# Patient Record
Sex: Female | Born: 1995 | Race: White | Hispanic: No | Marital: Single | Smoking: Never smoker
Health system: Southern US, Community
[De-identification: ages and names within clinical notes are randomized; demographics above are authoritative.]

## PROBLEM LIST (undated history)

## (undated) DIAGNOSIS — D690 Allergic purpura: Secondary | ICD-10-CM

## (undated) HISTORY — DX: Allergic purpura: D69.0

---

## 2017-01-26 ENCOUNTER — Encounter: Payer: Self-pay | Admitting: Emergency Medicine

## 2017-01-26 ENCOUNTER — Emergency Department
Admission: EM | Admit: 2017-01-26 | Discharge: 2017-01-26 | Disposition: A | Discharge status: Home / Self Care | Payer: No Typology Code available for payment source | Attending: Emergency Medicine | Admitting: Emergency Medicine

## 2017-01-26 ENCOUNTER — Emergency Department: Payer: No Typology Code available for payment source

## 2017-01-26 DIAGNOSIS — S40012A Contusion of left shoulder, initial encounter: Secondary | ICD-10-CM | POA: Diagnosis not present

## 2017-01-26 DIAGNOSIS — S20212A Contusion of left front wall of thorax, initial encounter: Secondary | ICD-10-CM | POA: Diagnosis not present

## 2017-01-26 DIAGNOSIS — Y999 Unspecified external cause status: Secondary | ICD-10-CM | POA: Diagnosis not present

## 2017-01-26 DIAGNOSIS — S299XXA Unspecified injury of thorax, initial encounter: Secondary | ICD-10-CM | POA: Diagnosis present

## 2017-01-26 DIAGNOSIS — Y92410 Unspecified street and highway as the place of occurrence of the external cause: Secondary | ICD-10-CM | POA: Insufficient documentation

## 2017-01-26 DIAGNOSIS — Y939 Activity, unspecified: Secondary | ICD-10-CM | POA: Diagnosis not present

## 2017-01-26 MED ORDER — MELOXICAM 15 MG PO TABS: 15.0000 mg | ORAL_TABLET | Freq: Every day | ORAL | 0 refills | Status: DC
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
## 2019-01-28 ENCOUNTER — Encounter: Payer: Self-pay | Admitting: Primary Care

## 2019-01-28 ENCOUNTER — Ambulatory Visit: Payer: 59 | Admitting: Primary Care

## 2019-01-28 ENCOUNTER — Other Ambulatory Visit: Payer: Self-pay

## 2019-01-28 VITALS — BP 120/78 | HR 82 | Temp 98.1°F | Ht 68.0 in | Wt 281.5 lb

## 2019-01-28 DIAGNOSIS — Z23 Encounter for immunization: Secondary | ICD-10-CM | POA: Diagnosis not present

## 2019-01-28 DIAGNOSIS — Z0000 Encounter for general adult medical examination without abnormal findings: Secondary | ICD-10-CM

## 2019-01-28 LAB — LIPID PANEL
Cholesterol: 190 mg/dL (ref 0–200)
HDL: 46.9 mg/dL (ref >39.00)
LDL CALC: 120 mg/dL — AB (ref 0–99)
NONHDL: 142.94
Total CHOL/HDL Ratio: 4
Triglycerides: 113 mg/dL (ref 0.0–149.0)
VLDL: 22.6 mg/dL (ref 0.0–40.0)

## 2019-01-28 LAB — COMPREHENSIVE METABOLIC PANEL
ALT: 20 U/L (ref 0–35)
AST: 16 U/L (ref 0–37)
Albumin: 4.6 g/dL (ref 3.5–5.2)
Alkaline Phosphatase: 73 U/L (ref 39–117)
BUN: 13 mg/dL (ref 6–23)
CHLORIDE: 106 meq/L (ref 96–112)
CO2: 24 meq/L (ref 19–32)
Calcium: 9.8 mg/dL (ref 8.4–10.5)
Creatinine, Ser: 0.81 mg/dL (ref 0.40–1.20)
GFR: 87.03 mL/min (ref >60.00)
GLUCOSE: 91 mg/dL (ref 70–99)
POTASSIUM: 4.3 meq/L (ref 3.5–5.1)
SODIUM: 138 meq/L (ref 135–145)
Total Bilirubin: 0.4 mg/dL (ref 0.2–1.2)
Total Protein: 7.4 g/dL (ref 6.0–8.3)

## 2019-01-28 LAB — HEMOGLOBIN A1C: Hgb A1c MFr Bld: 5.4 % (ref 4.6–6.5)

## 2019-01-28 NOTE — Patient Instructions (Signed)
Stop by the lab prior to leaving today. I will notify you of your results once received.   Start exercising. You should be getting 150 minutes of moderate intensity exercise weekly.  It's important to improve your diet by reducing consumption of fast food, fried food, processed snack foods, sugary drinks. Increase consumption of fresh vegetables and fruits, whole grains, water.  Ensure you are drinking 64 ounces of water daily.  Schedule a follow up visit for 2 months for your pap smear and HPV vaccination.   It was a pleasure to meet you today! Please don't hesitate to call or message me with any questions. Welcome to [facility]!   Preventive Care 18-39 Years, Female Preventive care refers to lifestyle choices and visits with your health care provider that can promote health and wellness. What does preventive care include?   A yearly physical exam. This is also called an annual well check.  Dental exams once or twice a year.  Routine eye exams. Ask your health care provider how often you should have your eyes checked.  Personal lifestyle choices, including: ? Daily care of your teeth and gums. ? Regular physical activity. ? Eating a healthy diet. ? Avoiding tobacco and drug use. ? Limiting alcohol use. ? Practicing safe sex. ? Taking vitamin and mineral supplements as recommended by your health care provider. What happens during an annual well check? The services and screenings done by your health care provider during your annual well check will depend on your age, overall health, lifestyle risk factors, and family history of disease. Counseling Your health care provider may ask you questions about your:  Alcohol use.  Tobacco use.  Drug use.  Emotional well-being.  Home and relationship well-being.  Sexual activity.  Eating habits.  Work and work Statistician.  Method of birth control.  Menstrual cycle.  Pregnancy history. Screening You may have the  following tests or measurements:  Height, weight, and BMI.  Diabetes screening. This is done by checking your blood sugar (glucose) after you have not eaten for a while (fasting).  Blood pressure.  Lipid and cholesterol levels. These may be checked every 5 years starting at age 56.  Skin check.  Hepatitis C blood test.  Hepatitis B blood test.  Sexually transmitted disease (STD) testing.  BRCA-related cancer screening. This may be done if you have a family history of breast, ovarian, tubal, or peritoneal cancers.  Pelvic exam and Pap test. This may be done every 3 years starting at age 50. Starting at age 37, this may be done every 5 years if you have a Pap test in combination with an HPV test. Discuss your test results, treatment options, and if necessary, the need for more tests with your health care provider. Vaccines Your health care provider may recommend certain vaccines, such as:  Influenza vaccine. This is recommended every year.  Tetanus, diphtheria, and acellular pertussis (Tdap, Td) vaccine. You may need a Td booster every 10 years.  Varicella vaccine. You may need this if you have not been vaccinated.  HPV vaccine. If you are 51 or younger, you may need three doses over 6 months.  Measles, mumps, and rubella (MMR) vaccine. You may need at least one dose of MMR. You may also need a second dose.  Pneumococcal 13-valent conjugate (PCV13) vaccine. You may need this if you have certain conditions and were not previously vaccinated.  Pneumococcal polysaccharide (PPSV23) vaccine. You may need one or two doses if you smoke cigarettes or if  you have certain conditions.  Meningococcal vaccine. One dose is recommended if you are age 22-21 years and a first-year college student living in a residence hall, or if you have one of several medical conditions. You may also need additional booster doses.  Hepatitis A vaccine. You may need this if you have certain conditions or if  you travel or work in places where you may be exposed to hepatitis A.  Hepatitis B vaccine. You may need this if you have certain conditions or if you travel or work in places where you may be exposed to hepatitis B.  Haemophilus influenzae type b (Hib) vaccine. You may need this if you have certain risk factors. Talk to your health care provider about which screenings and vaccines you need and how often you need them. This information is not intended to replace advice given to you by your health care provider. Make sure you discuss any questions you have with your health care provider. Document Released: 12/30/2001 Document Revised: 06/16/2017 Document Reviewed: 09/04/2015 Elsevier Interactive Patient Education  2019 [employer].

## 2019-01-28 NOTE — Assessment & Plan Note (Signed)
Tetanus due provided today. HPV series due, completed first dose today. Discussed returning for subsequent vaccinations.  Pap smear due as she has never completed, discussed to return for this later this year when ready.  Recommended to start with regular exercise, work on a healthy diet.  Exam unremarkable. Labs pending. Follow up in 1 year for CPE.

## 2019-01-28 NOTE — Progress Notes (Signed)
Subjective:    Patient ID: Yolanda Rojas, female    DOB: [birth year], 22 y.o.   MRN: [id number]  HPI  Yolanda Rojas is a 23 year old female who presents today to establish care and for complete physical. She has no complaints today.  Immunizations: -Tetanus: Unsure -Influenza: Did not complete, declines  -HPV: Never completed  Diet: She endorses a fair diet Breakfast: Skips Lunch: Skip Dinner: Meat, vegetable, starch, pizza, take out Snacks: Chips, crackers, cheese Desserts: 2-3 times weekly  Beverages: Water, unsweet  Exercise: No recent exercise  Eye exam: Completed several years ago Dental exam: Completes semi-annually  Pap Smear: Never completed,declines today  BP Readings from Last 3 Encounters:  01/28/19 120/78  01/26/17 136/79     Review of Systems  Constitutional: Negative for unexpected weight change.  HENT: Negative for rhinorrhea.   Respiratory: Negative for cough and shortness of breath.   Cardiovascular: Negative for chest pain.  Gastrointestinal: Negative for constipation and diarrhea.  Genitourinary: Negative for difficulty urinating and menstrual problem.  Musculoskeletal: Negative for arthralgias and myalgias.  Skin: Negative for rash.  Allergic/Immunologic: Negative for environmental allergies.  Neurological: Negative for dizziness, numbness and headaches.  Psychiatric/Behavioral: The patient is not nervous/anxious.        Past Medical History:  Diagnosis Date  . HSP (Henoch Schonlein purpura) (HCC)      Social History   Socioeconomic History  . Marital status: Single    Spouse name: Not on file  . Number of children: Not on file  . Years of education: Not on file  . Highest education level: Not on file  Occupational History  . Not on file  Social Needs  . Financial resource strain: Not on file  . Food insecurity:    Worry: Not on file    Inability: Not on file  . Transportation needs:    Medical: Not on file    Non-medical: Not on  file  Tobacco Use  . Smoking status: Never Smoker  . Smokeless tobacco: Never Used  Substance and Sexual Activity  . Alcohol use: Yes  . Drug use: Not on file  . Sexual activity: Not on file  Lifestyle  . Physical activity:    Days per week: Not on file    Minutes per session: Not on file  . Stress: Not on file  Relationships  . Social connections:    Talks on phone: Not on file    Gets together: Not on file    Attends religious service: Not on file    Active member of club or organization: Not on file    Attends meetings of clubs or organizations: Not on file    Relationship status: Not on file  . Intimate partner violence:    Fear of current or ex partner: Not on file    Emotionally abused: Not on file    Physically abused: Not on file    Forced sexual activity: Not on file  Other Topics Concern  . Not on file  Social History Narrative   Single.   Works at [employer].   From [place].   Enjoys Licensed conveyancer.     History reviewed. No pertinent surgical history.  History reviewed. No pertinent family history.  No Known Allergies  No current outpatient medications on file prior to visit.   No current facility-administered medications on file prior to visit.     BP 120/78   Pulse 82   Temp 98.1 F (36.7  C) (Oral)   Ht 5\' 8"  (1.727 m)   Wt 281 lb 8 oz (127.7 kg)   LMP 01/01/2019   SpO2 98%   BMI 42.80 kg/m    Objective:   Physical Exam  Constitutional: She is oriented to person, place, and time. She appears well-nourished.  HENT:  Mouth/Throat: No oropharyngeal exudate.  Eyes: Pupils are equal, round, and reactive to light. EOM are normal.  Neck: Neck supple. No thyromegaly present.  Cardiovascular: Normal rate and regular rhythm.  Respiratory: Effort normal and breath sounds normal.  GI: Soft. Bowel sounds are normal. There is no abdominal tenderness.  Musculoskeletal: Normal range of motion.  Neurological: She is alert and oriented  to person, place, and time.  Skin: Skin is warm and dry.  Psychiatric: She has a normal mood and affect.           Assessment & Plan:

## 2019-01-28 NOTE — Addendum Note (Signed)
Addended by: Tawnya Crook on: 01/28/2019 12:29 PM   Modules accepted: Orders

## 2019-03-30 ENCOUNTER — Ambulatory Visit (INDEPENDENT_AMBULATORY_CARE_PROVIDER_SITE_OTHER): Payer: 59

## 2019-03-30 ENCOUNTER — Ambulatory Visit: Payer: 59 | Admitting: Primary Care

## 2019-03-30 DIAGNOSIS — Z23 Encounter for immunization: Secondary | ICD-10-CM

## 2019-04-25 ENCOUNTER — Ambulatory Visit: Payer: 59 | Admitting: Primary Care

## 2019-05-03 ENCOUNTER — Ambulatory Visit: Payer: 59 | Admitting: Primary Care

## 2019-05-03 ENCOUNTER — Other Ambulatory Visit (HOSPITAL_COMMUNITY)
Admission: RE | Admit: 2019-05-03 | Discharge: 2019-05-03 | Disposition: A | Discharge status: Home / Self Care | Payer: 59 | Source: Ambulatory Visit | Attending: Primary Care | Admitting: Primary Care

## 2019-05-03 ENCOUNTER — Other Ambulatory Visit: Payer: Self-pay

## 2019-05-03 ENCOUNTER — Encounter: Payer: Self-pay | Admitting: Primary Care

## 2019-05-03 VITALS — BP 120/76 | HR 88 | Temp 98.6°F | Ht 68.0 in | Wt 290.0 lb

## 2019-05-03 DIAGNOSIS — Z124 Encounter for screening for malignant neoplasm of cervix: Secondary | ICD-10-CM | POA: Insufficient documentation

## 2019-05-03 NOTE — Progress Notes (Signed)
Subjective:    Patient ID: Yolanda Rojas, female    DOB: Jan 30, 1996, 23 y.o.   MRN: [id number]  HPI  Yolanda Rojas is a 23 year old female who presents today for pap smear.   She has never undergone a pap smear in the past. She underwent CPE in March 2020.  She denies vaginal symptoms, pelvic pain, urinary symptoms, abdominal pain.   Review of Systems  Constitutional: Negative for fever.  Gastrointestinal: Negative for abdominal pain.  Genitourinary: Negative for dysuria, frequency, menstrual problem and vaginal discharge.       Past Medical History:  Diagnosis Date  . HSP (Henoch Schonlein purpura) (HCC)      Social History   Socioeconomic History  . Marital status: Single    Spouse name: Not on file  . Number of children: Not on file  . Years of education: Not on file  . Highest education level: Not on file  Occupational History  . Not on file  Social Needs  . Financial resource strain: Not on file  . Food insecurity    Worry: Not on file    Inability: Not on file  . Transportation needs    Medical: Not on file    Non-medical: Not on file  Tobacco Use  . Smoking status: Never Smoker  . Smokeless tobacco: Never Used  Substance and Sexual Activity  . Alcohol use: Yes  . Drug use: Not on file  . Sexual activity: Not on file  Lifestyle  . Physical activity    Days per week: Not on file    Minutes per session: Not on file  . Stress: Not on file  Relationships  . Social Herbalist on phone: Not on file    Gets together: Not on file    Attends religious service: Not on file    Active member of club or organization: Not on file    Attends meetings of clubs or organizations: Not on file    Relationship status: Not on file  . Intimate partner violence    Fear of current or ex partner: Not on file    Emotionally abused: Not on file    Physically abused: Not on file    Forced sexual activity: Not on file  Other Topics Concern  . Not on file  Social  History Narrative   Single.   Works at [employer].   From [place].   Enjoys Animal nutritionist.     No past surgical history on file.  No family history on file.  No Known Allergies  No current outpatient medications on file prior to visit.   No current facility-administered medications on file prior to visit.     BP 120/76   Pulse 88   Temp 98.6 F (37 C) (Tympanic)   Ht 5\' 8"  (1.727 m)   Wt 290 lb (131.5 kg)   LMP 04/06/2019   SpO2 98%   BMI 44.09 kg/m    Objective:   Physical Exam  Constitutional: She appears well-nourished.  Respiratory: Effort normal.  GI: Soft. Bowel sounds are normal. There is no abdominal tenderness.  Genitourinary: There is no tenderness or lesion on the right labia. There is no tenderness or lesion on the left labia. Cervix exhibits discharge. Cervix exhibits no motion tenderness. Right adnexum displays no tenderness. Left adnexum displays no tenderness.    No vaginal discharge or erythema.  No erythema in the vagina.    Genitourinary Comments: Scant  amount of whitish vaginal discharge. No symptoms. No foul smell.   Skin: Skin is warm and dry.           Assessment & Plan:

## 2019-05-03 NOTE — Assessment & Plan Note (Signed)
Never completed. Pap smear and pelvic exam completed today. No abnormalities. Await results. Patient tolerated well.

## 2019-05-03 NOTE — Patient Instructions (Signed)
We will be in touch once we receive your pap smear results.   It was a pleasure to see you today!   Pap Test Why am I having this test? A Pap test, also called a Pap smear, is a screening test to check for signs of:  Cancer of the vagina, cervix, and uterus. The cervix is the lower part of the uterus that opens into the vagina.  Infection.  Changes that may be a sign that cancer is developing (precancerous changes). Women need this test on a regular basis. In general, you should have a Pap test every 3 years until you reach menopause or age 85. Women aged 30-60 may choose to have their Pap test done at the same time as an HPV (human papillomavirus) test every 5 years (instead of every 3 years). Your health care provider may recommend having Pap tests more or less often depending on your medical conditions and past Pap test results. What kind of sample is taken?  Your health care provider will collect a sample of cells from the surface of your cervix. This will be done using a small cotton swab, plastic spatula, or brush. This sample is often collected during a pelvic exam, when you are lying on your back on an exam table with feet in footrests (stirrups). In some cases, fluids (secretions) from the cervix or vagina may also be collected. How do I prepare for this test?  Be aware of where you are in your menstrual cycle. If you are menstruating on the day of the test, you may be asked to reschedule.  You may need to reschedule if you have a known vaginal infection on the day of the test.  Follow instructions from your health care provider about: ? Changing or stopping your regular medicines. Some medicines can cause abnormal test results, such as digitalis and tetracycline. ? Avoiding douching or taking a bath the day before or the day of the test. Tell a health care provider about:  Any allergies you have.  All medicines you are taking, including vitamins, herbs, eye drops, creams,  and over-the-counter medicines.  Any blood disorders you have.  Any surgeries you have had.  Any medical conditions you have.  Whether you are pregnant or may be pregnant. How are the results reported? Your test results will be reported as either abnormal or normal. A false-positive result can occur. A false positive is incorrect because it means that a condition is present when it is not. A false-negative result can occur. A false negative is incorrect because it means that a condition is not present when it is. What do the results mean? A normal test result means that you do not have signs of cancer of the vagina, cervix, or uterus. An abnormal result may mean that you have:  Cancer. A Pap test by itself is not enough to diagnose cancer. You will have more tests done in this case.  Precancerous changes in your vagina, cervix, or uterus.  Inflammation of the cervix.  An STD (sexually transmitted disease).  A fungal infection.  A parasite infection. Talk with your health care provider about what your results mean. Questions to ask your health care provider Ask your health care provider, or the department that is doing the test:  When will my results be ready?  How will I get my results?  What are my treatment options?  What other tests do I need?  What are my next steps? Summary  In general,  women should have a Pap test every 3 years until they reach menopause or age 45.  Your health care provider will collect a sample of cells from the surface of your cervix. This will be done using a small cotton swab, plastic spatula, or brush.  In some cases, fluids (secretions) from the cervix or vagina may also be collected. This information is not intended to replace advice given to you by your health care provider. Make sure you discuss any questions you have with your health care provider. Document Released: 01/24/2003 Document Revised: 07/13/2017 Document Reviewed:  07/13/2017 Elsevier Interactive Patient Education  2019 [employer].

## 2019-05-04 LAB — CYTOLOGY - PAP
Diagnosis: NEGATIVE
HPV: NOT DETECTED

## 2019-07-19 ENCOUNTER — Telehealth: Payer: 59 | Admitting: Physician Assistant

## 2019-07-19 DIAGNOSIS — J029 Acute pharyngitis, unspecified: Secondary | ICD-10-CM

## 2019-07-19 NOTE — Progress Notes (Addendum)
Based on what you shared with me, I feel your condition warrants further evaluation and I recommend that you be seen for a face to face office visit. Neck stiffness and voice change may be a sign of a more serious infection.   NOTE: If you entered your credit card information for this eVisit, you will not be charged. You may see a "hold" on your card for the $35 but that hold will drop off and you will not have a charge processed.  If you are having a true medical emergency please call 911.     For an urgent face to face visit, [place] has four urgent care centers for your convenience:   . [facility]    [phone]                  Get Driving Directions  [address], [place] [address] . 10 am to 8 pm Monday-Friday . 12 pm to 8 pm Saturday-Sunday   . [facility] at [place]                  Get Driving Directions  [address], [place], [place] [address] . 8 am to 8 pm Monday-Friday . 9 am to 6 pm Saturday . 11 am to 6 pm Sunday   . [facility] at [place]                  Get Driving Directions   [address], [place] [address] . 8 am to 8 pm Monday-Friday . 8 am to 4 pm Saturday-Sunday    . [facility] at [place]                    Get Driving Directions  [phone]  [address], [place],  [address]  . Monday-Friday, 12 PM to 6 PM    Your e-visit answers were reviewed by a board certified advanced clinical practitioner to complete your personal care plan.  Thank you for using e-Visits.  Greater than 10 minutes, yet less than 20 minutes of time have been spent researching, coordinating, and implementing care for this patient today.

## 2019-08-02 ENCOUNTER — Telehealth: Payer: 59 | Admitting: Primary Care

## 2019-09-21 ENCOUNTER — Telehealth: Payer: 59

## 2019-09-23 ENCOUNTER — Other Ambulatory Visit: Payer: Self-pay

## 2019-09-23 ENCOUNTER — Encounter: Payer: Self-pay | Admitting: Primary Care

## 2019-09-23 ENCOUNTER — Ambulatory Visit: Payer: 59 | Admitting: Primary Care

## 2019-09-23 DIAGNOSIS — Z30013 Encounter for initial prescription of injectable contraceptive: Secondary | ICD-10-CM

## 2019-09-23 DIAGNOSIS — Z309 Encounter for contraceptive management, unspecified: Secondary | ICD-10-CM | POA: Insufficient documentation

## 2019-09-23 DIAGNOSIS — Z3041 Encounter for surveillance of contraceptive pills: Secondary | ICD-10-CM | POA: Insufficient documentation

## 2019-09-23 LAB — POCT URINE PREGNANCY: Preg Test, Ur: NEGATIVE

## 2019-09-23 MED ORDER — MEDROXYPROGESTERONE ACETATE 150 MG/ML IM SUSP
150.0000 mg | Freq: Once | INTRAMUSCULAR | Status: AC
  Administered 2019-09-23: 150 mg via INTRAMUSCULAR

## 2019-09-23 NOTE — Assessment & Plan Note (Signed)
Patient elects for Depo Provera injection for pregnancy prevention. Discussed potential for weight gain, she would still like to proceed. She does not smoke.  Urine pregnancy today negative. Discussed when to return for subsequent injections. Discussed that it may take 3-6 months for regulation of menstrual cycles and to call me if she has consistent bleeding. Discussed to use back up protection for the next 2 weeks. Also discussed that this will not prevent STD's, use condoms.   Handout provided for returning for next injection. Encouraged a healthy diet and regular exercise.

## 2019-09-23 NOTE — Progress Notes (Signed)
Subjective:    Patient ID: Yolanda Rojas, female    DOB: 08-16-1996, 23 y.o.   MRN: [id number]  HPI  Yolanda Rojas is a 23 year old female who presents today requesting birth control treatment.  Menarche at age 68. She has previously managed on birth control pills in high school for heavy and long menstrual cycles with significant improvement. Menstrual cycles are lighter overall and last five days now.   She would like birth control now for pregnancy prevention. She is using condoms for STD prevention. She would like to have the Depo Provera injection as she cannot remember to take pills. She does not smoke.  Review of Systems  Respiratory: Negative for shortness of breath.   Cardiovascular: Negative for palpitations.  Gastrointestinal: Negative for abdominal pain.  Genitourinary: Negative for menstrual problem and vaginal discharge.  Neurological: Negative for dizziness.       Past Medical History:  Diagnosis Date  . HSP (Henoch Schonlein purpura) (HCC)      Social History   Socioeconomic History  . Marital status: Single    Spouse name: Not on file  . Number of children: Not on file  . Years of education: Not on file  . Highest education level: Not on file  Occupational History  . Not on file  Social Needs  . Financial resource strain: Not on file  . Food insecurity    Worry: Not on file    Inability: Not on file  . Transportation needs    Medical: Not on file    Non-medical: Not on file  Tobacco Use  . Smoking status: Never Smoker  . Smokeless tobacco: Never Used  Substance and Sexual Activity  . Alcohol use: Yes  . Drug use: Not on file  . Sexual activity: Not on file  Lifestyle  . Physical activity    Days per week: Not on file    Minutes per session: Not on file  . Stress: Not on file  Relationships  . Social Herbalist on phone: Not on file    Gets together: Not on file    Attends religious service: Not on file    Active member of club or  organization: Not on file    Attends meetings of clubs or organizations: Not on file    Relationship status: Not on file  . Intimate partner violence    Fear of current or ex partner: Not on file    Emotionally abused: Not on file    Physically abused: Not on file    Forced sexual activity: Not on file  Other Topics Concern  . Not on file  Social History Narrative   Single.   Works at [employer].   From [place].   Enjoys Animal nutritionist.     No past surgical history on file.  No family history on file.  No Known Allergies  No current outpatient medications on file prior to visit.   No current facility-administered medications on file prior to visit.     BP 122/76   Pulse 93   Temp (!) 96.7 F (35.9 C) (Temporal)   Ht 5\' 8"  (1.727 m)   Wt 279 lb 12 oz (126.9 kg)   LMP 08/21/2019   SpO2 99%   BMI 42.54 kg/m    Objective:   Physical Exam  Constitutional: She appears well-nourished.  Neck: Neck supple.  Cardiovascular: Normal rate and regular rhythm.  Respiratory: Effort normal and breath sounds  normal.  Skin: Skin is warm and dry.  Psychiatric: She has a normal mood and affect.           Assessment & Plan:

## 2019-09-23 NOTE — Addendum Note (Signed)
Addended by: Jacqualin Combes on: 09/23/2019 12:55 PM   Modules accepted: Orders

## 2019-09-23 NOTE — Patient Instructions (Signed)
You were provided with your first injection of birth control today, please return for subsequent injections as discussed.   It may take three to six months for birth control pills to regulate your cycle, call me if you have consistent bleeding.  Use condoms for STD prevention.  It was a pleasure to see you today!  Medroxyprogesterone injection [Contraceptive] What is this medicine? MEDROXYPROGESTERONE (me DROX ee proe JES te rone) contraceptive injections prevent pregnancy. They provide effective birth control for 3 months. Depo-subQ Provera 104 is also used for treating pain related to endometriosis. This medicine may be used for other purposes; ask your health care provider or pharmacist if you have questions. COMMON BRAND NAME(S): Depo-Provera, Depo-subQ Provera 104 What should I tell my health care provider before I take this medicine? They need to know if you have any of these conditions:  frequently drink alcohol  asthma  blood vessel disease or a history of a blood clot in the lungs or legs  bone disease such as osteoporosis  breast cancer  diabetes  eating disorder (anorexia nervosa or bulimia)  high blood pressure  HIV infection or AIDS  kidney disease  liver disease  mental depression  migraine  seizures (convulsions)  stroke  tobacco smoker  vaginal bleeding  an unusual or allergic reaction to medroxyprogesterone, other hormones, medicines, foods, dyes, or preservatives  pregnant or trying to get pregnant  breast-feeding How should I use this medicine? Depo-Provera Contraceptive injection is given into a muscle. Depo-subQ Provera 104 injection is given under the skin. These injections are given by a health care professional. You must not be pregnant before getting an injection. The injection is usually given during the first 5 days after the start of a menstrual period or 6 weeks after delivery of a baby. Talk to your pediatrician regarding the use  of this medicine in children. Special care may be needed. These injections have been used in female children who have started having menstrual periods. Overdosage: If you think you have taken too much of this medicine contact a poison control center or emergency room at once. NOTE: This medicine is only for you. Do not share this medicine with others. What if I miss a dose? Try not to miss a dose. You must get an injection once every 3 months to maintain birth control. If you cannot keep an appointment, call and reschedule it. If you wait longer than 13 weeks between Depo-Provera contraceptive injections or longer than 14 weeks between Depo-subQ Provera 104 injections, you could get pregnant. Use another method for birth control if you miss your appointment. You may also need a pregnancy test before receiving another injection. What may interact with this medicine? Do not take this medicine with any of the following medications:  bosentan This medicine may also interact with the following medications:  aminoglutethimide  antibiotics or medicines for infections, especially rifampin, rifabutin, rifapentine, and griseofulvin  aprepitant  barbiturate medicines such as phenobarbital or primidone  bexarotene  carbamazepine  medicines for seizures like ethotoin, felbamate, oxcarbazepine, phenytoin, topiramate  modafinil  St. John's wort This list may not describe all possible interactions. Give your health care provider a list of all the medicines, herbs, non-prescription drugs, or dietary supplements you use. Also tell them if you smoke, drink alcohol, or use illegal drugs. Some items may interact with your medicine. What should I watch for while using this medicine? This drug does not protect you against HIV infection (AIDS) or other sexually transmitted diseases.  Use of this product may cause you to lose calcium from your bones. Loss of calcium may cause weak bones (osteoporosis). Only use  this product for more than 2 years if other forms of birth control are not right for you. The longer you use this product for birth control the more likely you will be at risk for weak bones. Ask your health care professional how you can keep strong bones. You may have a change in bleeding pattern or irregular periods. Many females stop having periods while taking this drug. If you have received your injections on time, your chance of being pregnant is very low. If you think you may be pregnant, see your health care professional as soon as possible. Tell your health care professional if you want to get pregnant within the next year. The effect of this medicine may last a long time after you get your last injection. What side effects may I notice from receiving this medicine? Side effects that you should report to your doctor or health care professional as soon as possible:  allergic reactions like skin rash, itching or hives, swelling of the face, lips, or tongue  breast tenderness or discharge  breathing problems  changes in vision  depression  feeling faint or lightheaded, falls  fever  pain in the abdomen, chest, groin, or leg  problems with balance, talking, walking  unusually weak or tired  yellowing of the eyes or skin Side effects that usually do not require medical attention (report to your doctor or health care professional if they continue or are bothersome):  acne  fluid retention and swelling  headache  irregular periods, spotting, or absent periods  temporary pain, itching, or skin reaction at site where injected  weight gain This list may not describe all possible side effects. Call your doctor for medical advice about side effects. You may report side effects to FDA at 1-800-FDA-1088. Where should I keep my medicine? This does not apply. The injection will be given to you by a health care professional. NOTE: This sheet is a summary. It may not cover all  possible information. If you have questions about this medicine, talk to your doctor, pharmacist, or health care provider.  2020 Elsevier/Gold Standard (2008-11-24 18:37:56)

## 2019-12-14 ENCOUNTER — Ambulatory Visit: Payer: 59

## 2019-12-14 ENCOUNTER — Other Ambulatory Visit: Payer: Self-pay

## 2019-12-14 DIAGNOSIS — Z30013 Encounter for initial prescription of injectable contraceptive: Secondary | ICD-10-CM

## 2019-12-14 MED ORDER — MEDROXYPROGESTERONE ACETATE 150 MG/ML IM SUSP: 150.0000 mg | Freq: Once | INTRAMUSCULAR | Status: DC

## 2019-12-14 NOTE — Progress Notes (Signed)
Pt came to [facility] today and right before injection reported she was much more moody than usual since starting the depo injections. Discussed with pt there are alternatives and she could talk to PCP about those if she wanted to. Pt requested OV with PCP before time ran out for having another depo. She has until 12/23/19 to get this injection. Pt did not want to get the injection at this visit so injection was not given. Msg sent to IT to have administration removed and any charge. Pt will have PCP apt on 12/21/19 to discuss alternative contraceptives.   Pt was appreciative.

## 2019-12-21 ENCOUNTER — Encounter: Payer: Self-pay | Admitting: Primary Care

## 2019-12-21 ENCOUNTER — Ambulatory Visit: Payer: 59 | Admitting: Primary Care

## 2019-12-21 ENCOUNTER — Other Ambulatory Visit: Payer: Self-pay

## 2019-12-21 DIAGNOSIS — Z30011 Encounter for initial prescription of contraceptive pills: Secondary | ICD-10-CM

## 2019-12-21 LAB — POCT URINE PREGNANCY: Preg Test, Ur: NEGATIVE

## 2019-12-21 MED ORDER — NORETHINDRONE ACET-ETHINYL EST 1-20 MG-MCG PO TABS: ORAL_TABLET | ORAL | 3 refills | Status: DC

## 2019-12-21 NOTE — Patient Instructions (Signed)
Start your birth control pills Sunday this coming week.  Take 1 tablet by mouth once daily for three consecutive months then break for one week for a menstrual cycle.  It was a pleasure to see you today!

## 2019-12-21 NOTE — Assessment & Plan Note (Addendum)
Patient desires to switch back to OCP's from Depo Provera due to reasons mentioned in HPI.  Urine pregnancy test today negative. Rx for Junel 1/20 sent to pharmacy. We will have her take these continuously for three months then break for menstrual cycle.   She will update.

## 2019-12-21 NOTE — Progress Notes (Signed)
Subjective:    Patient ID: Yolanda Rojas, female    DOB: 06-09-1996, 24 y.o.   MRN: [id number]  HPI  This visit occurred during the SARS-CoV-2 public health emergency.  Safety protocols were in place, including screening questions prior to the visit, additional usage of staff PPE, and extensive cleaning of exam room while observing appropriate contact time as indicated for disinfecting solutions.   Ms. Yolanda Rojas is a 24 year old female who presents today to discuss birth control options.  She was initiated on Depo Provera in November 2020 due to desires of pregnancy prevention and management of heavy and painful menses. Since then she's had intermittent symptoms of tearfulness, mood swings, irritability for no reason. She will lash out to her family for no reason. She was once managed on OCP's (3 consecutive months at a time) years ago, did well overall but did forget to take pills on occasion.  She was once managed on three month continuous birth control due to painful and heavy menstrual cycles. Family history of endometriosis in her mother. She would like to stay on birth control for pregnancy prevention and for management of cycles. She does spot once monthly, last spotting episode began January 20th, 2021.  She is sexually active. She denies vaginal discharge and itching.    Review of Systems  Genitourinary: Negative for vaginal discharge.       See HPI  Psychiatric/Behavioral: The patient is nervous/anxious.        See HPI       Past Medical History:  Diagnosis Date  . HSP (Henoch Schonlein purpura) (HCC)      Social History   Socioeconomic History  . Marital status: Single    Spouse name: Not on file  . Number of children: Not on file  . Years of education: Not on file  . Highest education level: Not on file  Occupational History  . Not on file  Tobacco Use  . Smoking status: Never Smoker  . Smokeless tobacco: Never Used  Substance and Sexual Activity  . Alcohol use:  Yes  . Drug use: Not on file  . Sexual activity: Not on file  Other Topics Concern  . Not on file  Social History Narrative   Single.   Works at [employer].   From [place].   Enjoys Animal nutritionist.    Social Determinants of Health   Financial Resource Strain:   . Difficulty of Paying Living Expenses: Not on file  Food Insecurity:   . Worried About Charity fundraiser in the Last Year: Not on file  . Ran Out of Food in the Last Year: Not on file  Transportation Needs:   . Lack of Transportation (Medical): Not on file  . Lack of Transportation (Non-Medical): Not on file  Physical Activity:   . Days of Exercise per Week: Not on file  . Minutes of Exercise per Session: Not on file  Stress:   . Feeling of Stress : Not on file  Social Connections:   . Frequency of Communication with Friends and Family: Not on file  . Frequency of Social Gatherings with Friends and Family: Not on file  . Attends Religious Services: Not on file  . Active Member of Clubs or Organizations: Not on file  . Attends Archivist Meetings: Not on file  . Marital Status: Not on file  Intimate Partner Violence:   . Fear of Current or Ex-Partner: Not on file  . Emotionally  Abused: Not on file  . Physically Abused: Not on file  . Sexually Abused: Not on file    No past surgical history on file.  No family history on file.  No Known Allergies  No current outpatient medications on file prior to visit.   No current facility-administered medications on file prior to visit.    BP 120/76   Pulse (!) 112   Temp (!) 96.1 F (35.6 C) (Temporal)   Ht 5\' 8"  (1.727 m)   Wt 285 lb 4 oz (129.4 kg)   SpO2 98%   BMI 43.37 kg/m    Objective:   Physical Exam  Constitutional: She appears well-nourished.  Cardiovascular: Normal rate and regular rhythm.  Musculoskeletal:     Cervical back: Neck supple.  Skin: Skin is warm and dry.  Psychiatric: She has a normal mood and affect.             Assessment & Plan:

## 2020-02-23 ENCOUNTER — Ambulatory Visit: Payer: 59 | Admitting: Primary Care

## 2020-03-22 ENCOUNTER — Ambulatory Visit: Payer: 59 | Admitting: Primary Care

## 2020-03-22 ENCOUNTER — Other Ambulatory Visit: Payer: Self-pay

## 2020-03-22 ENCOUNTER — Encounter: Payer: Self-pay | Admitting: Primary Care

## 2020-03-22 DIAGNOSIS — Z3041 Encounter for surveillance of contraceptive pills: Secondary | ICD-10-CM

## 2020-03-22 MED ORDER — NORGESTIMATE-ETH ESTRADIOL 0.25-35 MG-MCG PO TABS: 1.0000 | ORAL_TABLET | Freq: Every day | ORAL | 1 refills | Status: DC

## 2020-03-22 NOTE — Assessment & Plan Note (Signed)
Weekly breakthrough vaginal spotting and bleeding, also with pain and bleeding at times during intercourse.  Compliant to OCP's, has not missed any pills. Also doing much better in regards to mood swings and irritability on OCP's.   Discussed that it can take up to 6 months to regulate cycles. She would still like to proceed with dose change. Stop Junel 1/20. Start Sprintec 0.25/35.   Continue three months with break for menstrual cycle after third pack. She will update.

## 2020-03-22 NOTE — Patient Instructions (Signed)
Resume your birth control the Sunday after your period starts.  Please update me in 3 months.  It was a pleasure to see you today!

## 2020-03-22 NOTE — Progress Notes (Signed)
Subjective:    Patient ID: Yolanda Rojas, female    DOB: [birth year], 24 y.o.   MRN: [id number]  HPI  This visit occurred during the SARS-CoV-2 public health emergency.  Safety protocols were in place, including screening questions prior to the visit, additional usage of staff PPE, and extensive cleaning of exam room while observing appropriate contact time as indicated for disinfecting solutions.   Yolanda Rojas is a 24 year old female who presents today to discuss birth control.   She is currently managed on Junel 1/20 pills which was initiated in February 2021 due to symptoms of mood swings, tearfulness, irritability that she experienced while on Depo Provera. She endorsed doing well on OCP's continuously in the past and wanted to try pills again.  Since her last visit she's noticing spotting/bleeding vaginally intermittently, once weekly on average lasting 1-2 days. She did have a menstrual cycle after completing her third pack, takes three packs continuously. She is currently on her menstrual cycle, overall not heavy or painful.  She's also noticed intermittent vaginal bleeding and cramping during and after intercourse. Given all of these symptoms she would like to try a different birth control pill.   She has noticed a significant improvement in her mood and irritability since switching over to OCP's.   Review of Systems  Genitourinary: Positive for menstrual problem.       See HPI  Psychiatric/Behavioral:       Improvement in mood       Past Medical History:  Diagnosis Date  . HSP (Henoch Schonlein purpura) (HCC)      Social History   Socioeconomic History  . Marital status: Single    Spouse name: Not on file  . Number of children: Not on file  . Years of education: Not on file  . Highest education level: Not on file  Occupational History  . Not on file  Tobacco Use  . Smoking status: Never Smoker  . Smokeless tobacco: Never Used  Substance and Sexual Activity  .  Alcohol use: Yes  . Drug use: Not on file  . Sexual activity: Not on file  Other Topics Concern  . Not on file  Social History Narrative   Single.   Works at [employer].   From [place].   Enjoys Licensed conveyancer.    Social Determinants of Health   Financial Resource Strain:   . Difficulty of Paying Living Expenses:   Food Insecurity:   . Worried About Programme researcher, broadcasting/film/video in the Last Year:   . Barista in the Last Year:   Transportation Needs:   . Freight forwarder (Medical):   Marland Kitchen Lack of Transportation (Non-Medical):   Physical Activity:   . Days of Exercise per Week:   . Minutes of Exercise per Session:   Stress:   . Feeling of Stress :   Social Connections:   . Frequency of Communication with Friends and Family:   . Frequency of Social Gatherings with Friends and Family:   . Attends Religious Services:   . Active Member of Clubs or Organizations:   . Attends Banker Meetings:   Marland Kitchen Marital Status:   Intimate Partner Violence:   . Fear of Current or Ex-Partner:   . Emotionally Abused:   Marland Kitchen Physically Abused:   . Sexually Abused:     No past surgical history on file.  No family history on file.  No Known Allergies  No current outpatient  medications on file prior to visit.   No current facility-administered medications on file prior to visit.    BP 124/80   Pulse 90   Temp (!) 96.5 F (35.8 C) (Temporal)   Ht 5\' 8"  (1.727 m)   Wt 290 lb 4 oz (131.7 kg)   SpO2 98%   BMI 44.13 kg/m    Objective:   Physical Exam  Constitutional: She appears well-nourished.  Cardiovascular: Normal rate and regular rhythm.  Respiratory: Effort normal and breath sounds normal.  Musculoskeletal:     Cervical back: Neck supple.  Skin: Skin is warm and dry.  Psychiatric: She has a normal mood and affect.           Assessment & Plan:

## 2020-09-04 ENCOUNTER — Other Ambulatory Visit: Payer: Self-pay | Admitting: Primary Care

## 2020-09-04 DIAGNOSIS — Z3041 Encounter for surveillance of contraceptive pills: Secondary | ICD-10-CM

## 2022-06-20 ENCOUNTER — Encounter: Payer: Self-pay | Admitting: Primary Care

## 2022-06-20 ENCOUNTER — Ambulatory Visit (INDEPENDENT_AMBULATORY_CARE_PROVIDER_SITE_OTHER): Payer: 59 | Admitting: Primary Care

## 2022-06-20 VITALS — BP 124/86 | HR 86 | Temp 98.6°F | Ht 68.0 in | Wt 329.0 lb

## 2022-06-20 DIAGNOSIS — Z23 Encounter for immunization: Secondary | ICD-10-CM

## 2022-06-20 DIAGNOSIS — Z6841 Body Mass Index (BMI) 40.0 and over, adult: Secondary | ICD-10-CM | POA: Diagnosis not present

## 2022-06-20 DIAGNOSIS — R030 Elevated blood-pressure reading, without diagnosis of hypertension: Secondary | ICD-10-CM | POA: Insufficient documentation

## 2022-06-20 DIAGNOSIS — Z30011 Encounter for initial prescription of contraceptive pills: Secondary | ICD-10-CM

## 2022-06-20 DIAGNOSIS — Z0001 Encounter for general adult medical examination with abnormal findings: Secondary | ICD-10-CM

## 2022-06-20 DIAGNOSIS — Z113 Encounter for screening for infections with a predominantly sexual mode of transmission: Secondary | ICD-10-CM

## 2022-06-20 DIAGNOSIS — Z0000 Encounter for general adult medical examination without abnormal findings: Secondary | ICD-10-CM

## 2022-06-20 LAB — CBC
HCT: 42 % (ref 36.0–46.0)
Hemoglobin: 14.3 g/dL (ref 12.0–15.0)
MCHC: 34 g/dL (ref 30.0–36.0)
MCV: 92.3 fl (ref 78.0–100.0)
Platelets: 283 10*3/uL (ref 150.0–400.0)
RBC: 4.55 Mil/uL (ref 3.87–5.11)
RDW: 12.5 % (ref 11.5–15.5)
WBC: 4.7 10*3/uL (ref 4.0–10.5)

## 2022-06-20 LAB — HEMOGLOBIN A1C: Hgb A1c MFr Bld: 5.6 % (ref 4.6–6.5)

## 2022-06-20 LAB — COMPREHENSIVE METABOLIC PANEL
ALT: 38 U/L — ABNORMAL HIGH (ref 0–35)
AST: 30 U/L (ref 0–37)
Albumin: 4.4 g/dL (ref 3.5–5.2)
Alkaline Phosphatase: 77 U/L (ref 39–117)
BUN: 13 mg/dL (ref 6–23)
CO2: 26 mEq/L (ref 19–32)
Calcium: 9.8 mg/dL (ref 8.4–10.5)
Chloride: 104 mEq/L (ref 96–112)
Creatinine, Ser: 0.75 mg/dL (ref 0.40–1.20)
GFR: 109.29 mL/min (ref >60.00)
Glucose, Bld: 89 mg/dL (ref 70–99)
Potassium: 4.4 mEq/L (ref 3.5–5.1)
Sodium: 138 mEq/L (ref 135–145)
Total Bilirubin: 0.6 mg/dL (ref 0.2–1.2)
Total Protein: 7.2 g/dL (ref 6.0–8.3)

## 2022-06-20 LAB — TSH: TSH: 2.57 u[IU]/mL (ref 0.35–5.50)

## 2022-06-20 LAB — LIPID PANEL
Cholesterol: 240 mg/dL — ABNORMAL HIGH (ref 0–200)
HDL: 40.3 mg/dL (ref >39.00)
LDL Cholesterol: 161 mg/dL — ABNORMAL HIGH (ref 0–99)
NonHDL: 199.27
Total CHOL/HDL Ratio: 6
Triglycerides: 190 mg/dL — ABNORMAL HIGH (ref 0.0–149.0)
VLDL: 38 mg/dL (ref 0.0–40.0)

## 2022-06-20 LAB — POCT URINE PREGNANCY: Preg Test, Ur: NEGATIVE

## 2022-06-20 MED ORDER — NORGESTIMATE-ETH ESTRADIOL 0.25-35 MG-MCG PO TABS: 1.0000 | ORAL_TABLET | Freq: Every day | ORAL | 3 refills | Status: DC

## 2022-06-20 NOTE — Assessment & Plan Note (Signed)
Would like to resume OCP's. Urine pregnancy test negative today.  Rx for Sprintec 0.25-35 mg/mcg tablets sent to pharmacy. Discussed instructions for initiation of OPC's, back up protection for 2 weeks.

## 2022-06-20 NOTE — Patient Instructions (Addendum)
Take your first birth control pill on the Sunday after your period starts. For example, if you start your period on Monday, then take the birth control pill that following Sunday. If you start your period on Sunday, then take your first pill that same day.  You must take your pill at the same time each day.  If you miss a pill then take the pill as soon as you remember, and also take your pill at the regularly scheduled time, even if this means taking two pills in one day. If you miss more than two pills consecutively, then please call me.  It may take three to six months for birth control pills to regulate your cycle and improve symptoms.  Stop by the lab prior to leaving today. I will notify you of your results once received.   It was a pleasure to see you today!  Preventive Care 69-37 Years Old, Female Preventive care refers to lifestyle choices and visits with your health care provider that can promote health and wellness. Preventive care visits are also called wellness exams. What can I expect for my preventive care visit? Counseling During your preventive care visit, your health care provider may ask about your: Medical history, including: Past medical problems. Family medical history. Pregnancy history. Current health, including: Menstrual cycle. Method of birth control. Emotional well-being. Home life and relationship well-being. Sexual activity and sexual health. Lifestyle, including: Alcohol, nicotine or tobacco, and drug use. Access to firearms. Diet, exercise, and sleep habits. Work and work Statistician. Sunscreen use. Safety issues such as seatbelt and bike helmet use. Physical exam Your health care provider may check your: Height and weight. These may be used to calculate your BMI (body mass index). BMI is a measurement that tells if you are at a healthy weight. Waist circumference. This measures the distance around your waistline. This measurement also tells if you  are at a healthy weight and may help predict your risk of certain diseases, such as type 2 diabetes and high blood pressure. Heart rate and blood pressure. Body temperature. Skin for abnormal spots. What immunizations do I need?  Vaccines are usually given at various ages, according to a schedule. Your health care provider will recommend vaccines for you based on your age, medical history, and lifestyle or other factors, such as travel or where you work. What tests do I need? Screening Your health care provider may recommend screening tests for certain conditions. This may include: Pelvic exam and Pap test. Lipid and cholesterol levels. Diabetes screening. This is done by checking your blood sugar (glucose) after you have not eaten for a while (fasting). Hepatitis B test. Hepatitis C test. HIV (human immunodeficiency virus) test. STI (sexually transmitted infection) testing, if you are at risk. BRCA-related cancer screening. This may be done if you have a family history of breast, ovarian, tubal, or peritoneal cancers. Talk with your health care provider about your test results, treatment options, and if necessary, the need for more tests. Follow these instructions at home: Eating and drinking  Eat a healthy diet that includes fresh fruits and vegetables, whole grains, lean protein, and low-fat dairy products. Take vitamin and mineral supplements as recommended by your health care provider. Do not drink alcohol if: Your health care provider tells you not to drink. You are pregnant, may be pregnant, or are planning to become pregnant. If you drink alcohol: Limit how much you have to 0-1 drink a day. Know how much alcohol is in your drink.  In the [place] one drink equals one 12 oz bottle of beer (355 mL), one 5 oz glass of wine (148 mL), or one 1 oz glass of hard liquor (44 mL). Lifestyle Brush your teeth every morning and night with fluoride toothpaste. Floss one time each day. Exercise  for at least 30 minutes 5 or more days each week. Do not use any products that contain nicotine or tobacco. These products include cigarettes, chewing tobacco, and vaping devices, such as e-cigarettes. If you need help quitting, ask your health care provider. Do not use drugs. If you are sexually active, practice safe sex. Use a condom or other form of protection to prevent STIs. If you do not wish to become pregnant, use a form of birth control. If you plan to become pregnant, see your health care provider for a prepregnancy visit. Find healthy ways to manage stress, such as: Meditation, yoga, or listening to music. Journaling. Talking to a trusted person. Spending time with friends and family. Minimize exposure to UV radiation to reduce your risk of skin cancer. Safety Always wear your seat belt while driving or riding in a vehicle. Do not drive: If you have been drinking alcohol. Do not ride with someone who has been drinking. If you have been using any mind-altering substances or drugs. While texting. When you are tired or distracted. Wear a helmet and other protective equipment during sports activities. If you have firearms in your house, make sure you follow all gun safety procedures. Seek help if you have been physically or sexually abused. What's next? Go to your health care provider once a year for an annual wellness visit. Ask your health care provider how often you should have your eyes and teeth checked. Stay up to date on all vaccines. This information is not intended to replace advice given to you by your health care provider. Make sure you discuss any questions you have with your health care provider. Document Revised: 05/01/2021 Document Reviewed: 05/01/2021 Elsevier Patient Education  [place].

## 2022-06-20 NOTE — Progress Notes (Signed)
Subjective:    Patient ID: Yolanda Rojas, female    DOB: 23-Apr-1996, 26 y.o.   MRN: [id number]  HPI  Yolanda Rojas is a very pleasant 26 y.o. female who presents today for complete physical and follow up of chronic conditions.  She would also like to resume birth control. She would like to resume birth control for regulation of menses and for pregnancy prevention. She was previously managed on Sprintec 0.25-35 mcg-mg and did well on this regimen.   Immunizations: -Tetanus: 2020 -Influenza: Did not complete last season  -Covid-19: Has not completed  -HPV: Completed 2 doses, due for final dose.  Diet: Fair diet.  Exercise: No regular exercise.   Eye exam: Completed 1 year ago Dental exam: Completed several years ago   Pap Smear: Completed in 2021    BP Readings from Last 3 Encounters:  06/20/22 124/86  03/22/20 124/80  12/21/19 120/76    Wt Readings from Last 3 Encounters:  06/20/22 (!) 329 lb (149.2 kg)  03/22/20 290 lb 4 oz (131.7 kg)  12/21/19 285 lb 4 oz (129.4 kg)       Review of Systems  Constitutional:  Negative for unexpected weight change.  HENT:  Negative for rhinorrhea.   Respiratory:  Negative for cough and shortness of breath.   Cardiovascular:  Negative for chest pain.  Gastrointestinal:  Negative for constipation and diarrhea.  Genitourinary:  Positive for menstrual problem. Negative for difficulty urinating.  Musculoskeletal:  Negative for arthralgias and myalgias.  Skin:  Negative for rash.  Allergic/Immunologic: Negative for environmental allergies.  Neurological:  Negative for dizziness and headaches.  Psychiatric/Behavioral:  The patient is not nervous/anxious.          Past Medical History:  Diagnosis Date   HSP (Henoch Schonlein purpura) (HCC)     Social History   Socioeconomic History   Marital status: Single    Spouse name: Not on file   Number of children: Not on file   Years of education: Not on file   Highest education  level: Not on file  Occupational History   Not on file  Tobacco Use   Smoking status: Never   Smokeless tobacco: Never  Substance and Sexual Activity   Alcohol use: Yes   Drug use: Not on file   Sexual activity: Not on file  Other Topics Concern   Not on file  Social History Narrative   Single.   Works at [employer].   From [place].   Enjoys Licensed conveyancer.    Social Determinants of Health   Financial Resource Strain: Not on file  Food Insecurity: Not on file  Transportation Needs: Not on file  Physical Activity: Not on file  Stress: Not on file  Social Connections: Not on file  Intimate Partner Violence: Not on file    History reviewed. No pertinent surgical history.  Family History  Problem Relation Age of Onset   Hypertension Mother    Endometriosis Mother    Hypertension Father    Heart attack Father    Diabetes Father     Allergies  Allergen Reactions   Strawberry Extract     Other reaction(s): Dermatologic Reaction    No current outpatient medications on file prior to visit.   No current facility-administered medications on file prior to visit.    BP 124/86   Pulse 86   Temp 98.6 F (37 C) (Oral)   Ht 5\' 8"  (1.727 m)   Wt (!) 329 lb (  149.2 kg)   LMP 05/17/2022   SpO2 98%   BMI 50.02 kg/m  Objective:   Physical Exam HENT:     Right Ear: Tympanic membrane and ear canal normal.     Left Ear: Tympanic membrane and ear canal normal.     Nose: Nose normal.  Eyes:     Conjunctiva/sclera: Conjunctivae normal.     Pupils: Pupils are equal, round, and reactive to light.  Neck:     Thyroid: No thyromegaly.  Cardiovascular:     Rate and Rhythm: Normal rate and regular rhythm.     Heart sounds: No murmur heard. Pulmonary:     Effort: Pulmonary effort is normal.     Breath sounds: Normal breath sounds. No rales.  Abdominal:     General: Bowel sounds are normal.     Palpations: Abdomen is soft.     Tenderness: There is no  abdominal tenderness.  Musculoskeletal:        General: Normal range of motion.     Cervical back: Neck supple.  Lymphadenopathy:     Cervical: No cervical adenopathy.  Skin:    General: Skin is warm and dry.     Findings: No rash.  Neurological:     Mental Status: She is alert and oriented to person, place, and time.     Cranial Nerves: No cranial nerve deficit.     Deep Tendon Reflexes: Reflexes are normal and symmetric.  Psychiatric:        Mood and Affect: Mood normal.           Assessment & Plan:   Problem List Items Addressed This Visit       Other   Preventative health care - Primary    3rd HPV vaccine due, provided today. Pap smear UTD  She would like to be tested for HSV as her boyfriend tested positive. She declines other STD testing.  Discussed the importance of a healthy diet and regular exercise in order for weight loss, and to reduce the risk of further co-morbidity.  Exam stable. Labs pending.  Follow up in 1 year for repeat physical.       Encounter for birth control    Would like to resume OCP's. Urine pregnancy test negative today.  Rx for Sprintec 0.25-35 mg/mcg tablets sent to pharmacy. Discussed instructions for initiation of OPC's, back up protection for 2 weeks.      Relevant Medications   norgestimate-ethinyl estradiol (SPRINTEC 28) 0.25-35 MG-MCG tablet   Elevated blood pressure reading in office without diagnosis of hypertension    Moderate weight gain since last visit. Discussed the importance of a healthy diet and regular exercise in order for weight loss, and to reduce the risk of further co-morbidity.  Repeat BP today within normal range. Continue to monitor       Class 3 severe obesity due to excess calories without serious comorbidity with body mass index (BMI) of 50.0 to 59.9 in adult [facility])    Discussed to work on diet, resume regular exercise.       Relevant Orders   Hemoglobin A1c   Comprehensive metabolic panel    Lipid panel   CBC   TSH   Other Visit Diagnoses     Need for HPV vaccination       Relevant Orders   HPV 9-valent vaccine,Recombinat (Completed)   Screening for STD (sexually transmitted disease)       Relevant Orders   Herpes Simplex Virus 1 and 2 (  IgG), with Reflex to HSV-2 Inhibition          Doreene Nest, NP

## 2022-06-20 NOTE — Assessment & Plan Note (Addendum)
3rd HPV vaccine due, provided today. Pap smear UTD  She would like to be tested for HSV as her boyfriend tested positive. She declines other STD testing.  Discussed the importance of a healthy diet and regular exercise in order for weight loss, and to reduce the risk of further co-morbidity.  Exam stable. Labs pending.  Follow up in 1 year for repeat physical.

## 2022-06-20 NOTE — Assessment & Plan Note (Signed)
Discussed to work on diet, resume regular exercise.

## 2022-06-20 NOTE — Assessment & Plan Note (Addendum)
Moderate weight gain since last visit. Discussed the importance of a healthy diet and regular exercise in order for weight loss, and to reduce the risk of further co-morbidity.  Repeat BP today within normal range. Continue to monitor

## 2022-06-21 LAB — HERPES SIMPLEX VIRUS 1 AND 2 (IGG),REFLEX HSV-2 INHIBITION
HAV 1 IGG,TYPE SPECIFIC AB: <0.9 index
HSV 2 IGG,TYPE SPECIFIC AB: <0.9 index

## 2022-10-23 ENCOUNTER — Encounter: Payer: Self-pay | Admitting: Primary Care

## 2022-10-23 ENCOUNTER — Ambulatory Visit: Payer: 59 | Admitting: Primary Care

## 2022-10-23 VITALS — BP 136/68 | HR 67 | Temp 97.8°F | Ht 68.0 in | Wt 318.0 lb

## 2022-10-23 DIAGNOSIS — N926 Irregular menstruation, unspecified: Secondary | ICD-10-CM

## 2022-10-23 MED ORDER — LEVONORGESTREL-ETHINYL ESTRAD 0.15-30 MG-MCG PO TABS: 1.0000 | ORAL_TABLET | Freq: Every day | ORAL | 0 refills | Status: DC

## 2022-10-23 NOTE — Patient Instructions (Signed)
You will either be contacted via phone regarding your referral to GYN, or you may receive a letter on your MyChart portal from our referral team with instructions for scheduling an appointment. Please let us know if you have not been contacted by anyone within two weeks.  Start your new birth control medication once you are finished with your placebo week.  It was a pleasure to see you today!

## 2022-10-23 NOTE — Progress Notes (Signed)
Subjective:    Patient ID: Yolanda Rojas, female    DOB: 1996-04-29, 26 y.o.   MRN: [id number]  HPI  Yolanda Rojas is a very pleasant 26 y.o. female with a history of severe obesity, elevated blood pressure reading who presents today to discuss irregular menses.   She is currently managed on OCP's which was initiated in August 2023 for irregular menses. Since initiation she's experienced intermittent spotting until two weeks ago. Approximately two weeks ago she developed daily heavy menstrual bleeding. She began noticing intermittent clotting over the last week with severe pelvic cramping. Her heavy bleeding, clotting, and pelvic cramping have since resolved. She continues to notice light menses without clots. She has been faithful to her birth control pack. She has not been sexually active in months.   She has a family history of endometriosis in her mother. She is requesting a referral to GYN for further evaluation. She was once managed on Nordette 0.15-30 mg-mcg tablets which helped to regulate symptoms.    Review of Systems  Cardiovascular:  Negative for palpitations.  Genitourinary:  Positive for menstrual problem and pelvic pain. Negative for dysuria and urgency.  Neurological:  Negative for dizziness.         Past Medical History:  Diagnosis Date   HSP (Henoch Schonlein purpura) (HCC)     Social History   Socioeconomic History   Marital status: Single    Spouse name: Not on file   Number of children: Not on file   Years of education: Not on file   Highest education level: Not on file  Occupational History   Not on file  Tobacco Use   Smoking status: Never   Smokeless tobacco: Never  Substance and Sexual Activity   Alcohol use: Yes   Drug use: Not on file   Sexual activity: Not on file  Other Topics Concern   Not on file  Social History Narrative   Single.   Works at [employer].   From [place].   Enjoys Licensed conveyancer.    Social  Determinants of Health   Financial Resource Strain: Not on file  Food Insecurity: Not on file  Transportation Needs: Not on file  Physical Activity: Not on file  Stress: Not on file  Social Connections: Not on file  Intimate Partner Violence: Not on file    History reviewed. No pertinent surgical history.  Family History  Problem Relation Age of Onset   Hypertension Mother    Endometriosis Mother    Hypertension Father    Heart attack Father    Diabetes Father     Allergies  Allergen Reactions   Strawberry Extract     Other reaction(s): Dermatologic Reaction    No current outpatient medications on file prior to visit.   No current facility-administered medications on file prior to visit.    BP 136/68   Pulse 67   Temp 97.8 F (36.6 C) (Temporal)   Ht 5\' 8"  (1.727 m)   Wt (!) 318 lb (144.2 kg)   SpO2 98%   BMI 48.35 kg/m  Objective:   Physical Exam Cardiovascular:     Rate and Rhythm: Normal rate and regular rhythm.  Pulmonary:     Effort: Pulmonary effort is normal.     Breath sounds: Normal breath sounds.  Abdominal:     Palpations: Abdomen is soft.     Tenderness: There is no abdominal tenderness.  Musculoskeletal:     Cervical back: Neck supple.  Skin:  General: Skin is warm and dry.           Assessment & Plan:   Problem List Items Addressed This Visit       Other   Irregular periods/menstrual cycles - Primary    Discussed that it can take up to 6 months for menses to regulate with initiation of OCP's, but regardless she will need some additional evaluation given her extreme pelvic cramping with clotting and a family history of endometriosis.  Stop Sprintec. Start Nordette 0.15-30 mg-mcg as this was effective years ago.  Referral placed to GYN. Labs reviewed from August 2023.      Relevant Medications   levonorgestrel-ethinyl estradiol (NORDETTE) 0.15-30 MG-MCG tablet   Other Relevant Orders   Ambulatory referral to Obstetrics /  Gynecology       Doreene Nest, NP

## 2022-10-23 NOTE — Assessment & Plan Note (Addendum)
Discussed that it can take up to 6 months for menses to regulate with initiation of OCP's, but regardless she will need some additional evaluation given her extreme pelvic cramping with clotting and a family history of endometriosis.  Stop Sprintec. Start Nordette 0.15-30 mg-mcg as this was effective years ago.  Referral placed to GYN. Labs reviewed from August 2023.

## 2022-11-26 ENCOUNTER — Telehealth: Payer: Self-pay

## 2022-11-26 NOTE — Telephone Encounter (Signed)
Left message for pt to call office back regarding referral that was sent to [facility].  

## 2023-01-14 ENCOUNTER — Other Ambulatory Visit: Payer: Self-pay | Admitting: Primary Care

## 2023-01-14 DIAGNOSIS — N926 Irregular menstruation, unspecified: Secondary | ICD-10-CM

## 2023-07-09 ENCOUNTER — Other Ambulatory Visit: Payer: Self-pay | Admitting: Primary Care

## 2023-07-09 DIAGNOSIS — N926 Irregular menstruation, unspecified: Secondary | ICD-10-CM

## 2023-07-09 NOTE — Telephone Encounter (Signed)
Patient is due for CPE/follow up, this will be required prior to any further refills.  Please schedule, thank you!   

## 2023-07-09 NOTE — Telephone Encounter (Signed)
LVMTCB

## 2023-10-08 ENCOUNTER — Ambulatory Visit: Payer: 59 | Admitting: Primary Care

## 2023-11-15 ENCOUNTER — Other Ambulatory Visit: Payer: Self-pay | Admitting: Primary Care

## 2023-11-15 DIAGNOSIS — N926 Irregular menstruation, unspecified: Secondary | ICD-10-CM

## 2023-11-15 NOTE — Telephone Encounter (Signed)
Patient is due for CPE/follow up, this will be required prior to any further refills.  Please schedule, thank you!   

## 2023-11-16 NOTE — Telephone Encounter (Signed)
Lvmtcb, sent mychart message  

## 2023-12-20 ENCOUNTER — Other Ambulatory Visit: Payer: Self-pay | Admitting: Primary Care

## 2023-12-20 DIAGNOSIS — N926 Irregular menstruation, unspecified: Secondary | ICD-10-CM

## 2023-12-24 ENCOUNTER — Other Ambulatory Visit: Payer: Self-pay | Admitting: Primary Care

## 2023-12-24 DIAGNOSIS — N926 Irregular menstruation, unspecified: Secondary | ICD-10-CM

## 2024-03-19 ENCOUNTER — Emergency Department
Admission: EM | Admit: 2024-03-19 | Discharge: 2024-03-19 | Disposition: A | Discharge status: Home / Self Care | Payer: Self-pay | Attending: Emergency Medicine | Admitting: Emergency Medicine

## 2024-03-19 ENCOUNTER — Other Ambulatory Visit: Payer: Self-pay

## 2024-03-19 DIAGNOSIS — R1012 Left upper quadrant pain: Secondary | ICD-10-CM | POA: Insufficient documentation

## 2024-03-19 LAB — CBC WITH DIFFERENTIAL/PLATELET
Abs Immature Granulocytes: 0.02 K/uL (ref 0.00–0.07)
Basophils Absolute: 0 K/uL (ref 0.0–0.1)
Basophils Relative: 0 %
Eosinophils Absolute: 0 K/uL (ref 0.0–0.5)
Eosinophils Relative: 0 %
HCT: 44.5 % (ref 36.0–46.0)
Hemoglobin: 15.3 g/dL — ABNORMAL HIGH (ref 12.0–15.0)
Immature Granulocytes: 0 %
Lymphocytes Relative: 28 %
Lymphs Abs: 2.2 K/uL (ref 0.7–4.0)
MCH: 31.4 pg (ref 26.0–34.0)
MCHC: 34.4 g/dL (ref 30.0–36.0)
MCV: 91.2 fL (ref 80.0–100.0)
Monocytes Absolute: 0.6 K/uL (ref 0.1–1.0)
Monocytes Relative: 7 %
Neutro Abs: 5.1 K/uL (ref 1.7–7.7)
Neutrophils Relative %: 65 %
Platelets: 347 K/uL (ref 150–400)
RBC: 4.88 MIL/uL (ref 3.87–5.11)
RDW: 12.5 % (ref 11.5–15.5)
WBC: 8 K/uL (ref 4.0–10.5)
nRBC: 0 % (ref 0.0–0.2)

## 2024-03-19 LAB — URINALYSIS, ROUTINE W REFLEX MICROSCOPIC
Bilirubin Urine: NEGATIVE
Glucose, UA: NEGATIVE mg/dL
Hgb urine dipstick: NEGATIVE
Ketones, ur: NEGATIVE mg/dL
Leukocytes,Ua: NEGATIVE
Nitrite: NEGATIVE
Protein, ur: NEGATIVE mg/dL
Specific Gravity, Urine: 1.01 (ref 1.005–1.030)
pH: 5 (ref 5.0–8.0)

## 2024-03-19 LAB — BASIC METABOLIC PANEL WITH GFR
Anion gap: 11 (ref 5–15)
BUN: 10 mg/dL (ref 6–20)
CO2: 21 mmol/L — ABNORMAL LOW (ref 22–32)
Calcium: 9.8 mg/dL (ref 8.9–10.3)
Chloride: 105 mmol/L (ref 98–111)
Creatinine, Ser: 0.88 mg/dL (ref 0.44–1.00)
GFR, Estimated: >60 mL/min (ref >60)
Glucose, Bld: 103 mg/dL — ABNORMAL HIGH (ref 70–99)
Potassium: 3.8 mmol/L (ref 3.5–5.1)
Sodium: 137 mmol/L (ref 135–145)

## 2024-03-19 LAB — TROPONIN I (HIGH SENSITIVITY): Troponin I (High Sensitivity): <2 ng/L (ref <18)

## 2024-03-19 LAB — D-DIMER, QUANTITATIVE: D-Dimer, Quant: 0.33 ug{FEU}/mL (ref 0.00–0.50)

## 2024-03-19 NOTE — Discharge Instructions (Addendum)
 Follow-up with your bariatric team who is administering the weight loss medication.  Start taking Pepcid (famotidine) 20 mg twice daily for the next 2 weeks.  Return to the ER for new, worsening, or persistent severe abdominal pain, vomiting, fever, swelling to the abdomen, leg pain or swelling, or any other new or worsening symptoms that concern you.

## 2024-03-19 NOTE — ED Triage Notes (Signed)
 Pt to ed from home via POV for flank pain x 3 weeks. Pt is caox4, in no acute distress and ambulatory in triage.

## 2024-03-19 NOTE — ED Provider Triage Note (Signed)
 Emergency Medicine Provider Triage Evaluation Note  Yolanda Rojas , a 28 y.o. female  was evaluated in triage.  Pt complains of left shoulder pain, some shortness of breath this morning, left leg pain with a warm sensation like when your foot goes to sleep.  Patient is on birth control and semaglutide..  Review of Systems  Positive:  Negative:   Physical Exam  BP (!) 156/97 (BP Location: Left Arm)   Pulse (!) 121   Temp 98.6 F (37 C) (Oral)   Resp 18   Ht 5\' 8"  (1.727 m)   Wt 136.1 kg   SpO2 100%   BMI 45.61 kg/m  Gen:   Awake, no distress   Resp:  Normal effort  MSK:   Moves extremities without difficulty  Other:    Medical Decision Making  Medically screening exam initiated at 3:22 PM.  Appropriate orders placed.  Sala Draney was informed that the remainder of the evaluation will be completed by another provider, this initial triage assessment does not replace that evaluation, and the importance of remaining in the ED until their evaluation is complete.     Delsie Figures, PA-C 03/19/24 1523

## 2024-03-19 NOTE — ED Provider Notes (Addendum)
 [facility] Provider Note    Event Date/Time   First MD Initiated Contact with Patient 03/19/24 1642     (approximate)   History   Flank Pain (X 3 weeks)   HPI  Yolanda Rojas is a 28 y.o. female with history of obesity and elevated blood pressure who presents with left upper quadrant abdominal pain intermittent course over the last 3 weeks and roughly associated with when she started taking semaglutide shots for weight loss.  The patient states that she normally gets the shots in her legs although did get 1 in her abdomen.  This was after the pain started however.  She denies any nausea or vomiting.  She has no diarrhea or change in her bowel movements.  She denies any urinary symptoms.  She also reports some slight left leg discomfort and perceived swelling that started today.  I reviewed the past medical records.  The patient's most recent outpatient encounter was on 10/23/2022 for follow-up with primary care due to irregular menses.   Physical Exam   Triage Vital Signs: ED Triage Vitals  Encounter Vitals Group     BP 03/19/24 1519 (!) 156/97     Systolic BP Percentile --      Diastolic BP Percentile --      Pulse Rate 03/19/24 1519 (!) 121     Resp 03/19/24 1519 18     Temp 03/19/24 1519 98.6 F (37 C)     Temp Source 03/19/24 1519 Oral     SpO2 03/19/24 1519 100 %     Weight 03/19/24 1520 300 lb (136.1 kg)     Height 03/19/24 1520 5\' 8"  (1.727 m)     Head Circumference --      Peak Flow --      Pain Score 03/19/24 1525 5     Pain Loc --      Pain Education --      Exclude from Growth Chart --     Most recent vital signs: Vitals:   03/19/24 1519  BP: (!) 156/97  Pulse: (!) 121  Resp: 18  Temp: 98.6 F (37 C)  SpO2: 100%     General: Alert, well-appearing, no distress.  CV:  Good peripheral perfusion.  Resp:  Normal effort.  Abd:  Soft and nontender.  No distention.  Other:  No significant lower extremity edema.  No visible  asymmetry.  No calf or palpable swelling or tenderness.   ED Results / Procedures / Treatments   Labs (all labs ordered are listed, but only abnormal results are displayed) Labs Reviewed  BASIC METABOLIC PANEL WITH GFR - Abnormal; Notable for the following components:      Result Value   CO2 21 (*)    Glucose, Bld 103 (*)    All other components within normal limits  CBC WITH DIFFERENTIAL/PLATELET - Abnormal; Notable for the following components:   Hemoglobin 15.3 (*)    All other components within normal limits  URINALYSIS, ROUTINE W REFLEX MICROSCOPIC - Abnormal; Notable for the following components:   Color, Urine YELLOW (*)    APPearance CLEAR (*)    All other components within normal limits  D-DIMER, QUANTITATIVE  POC URINE PREG, ED  TROPONIN I (HIGH SENSITIVITY)     EKG  ED ECG REPORT I, Lind Repine, the attending physician, personally viewed and interpreted this ECG.  Date: 03/19/2024 EKG Time: 1524 Rate: 122 Rhythm: Sinus tachycardia QRS Axis: normal Intervals: normal ST/T Wave abnormalities: normal  Narrative Interpretation: no evidence of acute ischemia    RADIOLOGY    PROCEDURES:  Critical Care performed: No  Procedures   MEDICATIONS ORDERED IN ED: Medications - No data to display   IMPRESSION / MDM / ASSESSMENT AND PLAN / ED COURSE  I reviewed the triage vital signs and the nursing notes.  28 year old female with PMH as noted above presents with intermittent left upper quadrant abdominal pain for the last 3 weeks also with some left leg pain today.  On exam the patient is well-appearing.  She was tachycardic at triage but this had resolved by the time I examined her.  The abdomen is soft and nontender.  There is no perceptible left leg swelling or any tenderness on my exam.    Differential diagnosis includes, but is not limited to, side effect due to the semaglutide, gastritis, gastroparesis, PUD, muscular abdominal wall pain.  There is  no evidence of an acute process in the legs.  There is no evidence of cellulitis or any significant swelling to suggest DVT.  The patient mentions a history of HSP and was concerned because of the abdominal pain.  However, the patient has no rash or purpura, no arthralgias, and normal kidney function.  Patient's presentation is most consistent with acute complicated illness / injury requiring diagnostic workup.  Lab workup obtained from triage is reassuring.  BMP and CBC show no acute findings.  Urinalysis is negative.  Troponin and D-dimer were also sent from triage although the patient does not have chest pain.  These are also both negative.  I did discuss with the patient my low suspicion for DVT but offered an ultrasound for further evaluation.  However, the patient states that she feels fine and declines a DVT study at this time.  I counseled her on the results of the workup.  At this time, there is no indication for CT or other imaging of the abdomen given the intermittent and relatively mild nature of the symptoms as well as the reassuring exam.  I recommended that the patient start on an antacid.  I advised her to follow-up with her primary care provider as well as her bariatric providers.  I gave strict return precautions, and she expressed understanding.   FINAL CLINICAL IMPRESSION(S) / ED DIAGNOSES   Final diagnoses:  Left upper quadrant abdominal pain     Rx / DC Orders   ED Discharge Orders     None        Note:  This document was prepared using Dragon voice recognition software and may include unintentional dictation errors.    Lind Repine, MD 03/19/24 [id number]    Lind Repine, MD 03/19/24 [phone]

## 2024-04-21 ENCOUNTER — Ambulatory Visit (INDEPENDENT_AMBULATORY_CARE_PROVIDER_SITE_OTHER): Payer: Self-pay | Admitting: Primary Care

## 2024-04-21 ENCOUNTER — Encounter: Payer: Self-pay | Admitting: Primary Care

## 2024-04-21 VITALS — BP 126/78 | HR 79 | Temp 98.0°F | Ht 68.0 in | Wt 301.0 lb

## 2024-04-21 DIAGNOSIS — Z3041 Encounter for surveillance of contraceptive pills: Secondary | ICD-10-CM

## 2024-04-21 DIAGNOSIS — N921 Excessive and frequent menstruation with irregular cycle: Secondary | ICD-10-CM | POA: Insufficient documentation

## 2024-04-21 DIAGNOSIS — N926 Irregular menstruation, unspecified: Secondary | ICD-10-CM

## 2024-04-21 MED ORDER — LEVONORGESTREL-ETHINYL ESTRAD 0.15-30 MG-MCG PO TABS: 1.0000 | ORAL_TABLET | Freq: Every day | ORAL | 0 refills | Status: DC

## 2024-04-21 NOTE — Patient Instructions (Signed)
 Please schedule a physical to meet with me in August or September.  It was a pleasure to see you today!

## 2024-04-21 NOTE — Assessment & Plan Note (Signed)
 Controlled.  Resume OCPs continuously. Refills provided.  Will update Pap smear in August/September when she has insurance.

## 2024-04-21 NOTE — Assessment & Plan Note (Signed)
 Resume OCPs continuously. Will update Pap smear in August/September when she has insurance.  Refill provided.

## 2024-04-21 NOTE — Progress Notes (Signed)
 Subjective:    Patient ID: Yolanda Rojas, female    DOB: [birth year], 28 y.o.   MRN: [id number]  HPI  Yolanda Rojas is a very pleasant 28 y.o. female who presents today for medication refill.   She is needing refills of her OCPs. She takes her OCPs continuously to avoid menorrhagia. Her LMP was on May 31, overall light, still going now. She ran out of her OCPs one week ago.  She denies vaginal discharge. Last pap smear was in 2021. No prior history of abnormal pap smears. She does not have insurance right now but plans on coming back in August when she does.    Review of Systems  Respiratory:  Negative for shortness of breath.   Cardiovascular:  Negative for chest pain.  Genitourinary:  Negative for menstrual problem, pelvic pain and vaginal discharge.         Past Medical History:  Diagnosis Date   HSP (Henoch Schonlein purpura) (HCC)     Social History   Socioeconomic History   Marital status: Single    Spouse name: Not on file   Number of children: Not on file   Years of education: Not on file   Highest education level: Not on file  Occupational History   Not on file  Tobacco Use   Smoking status: Never   Smokeless tobacco: Never  Substance and Sexual Activity   Alcohol use: Yes   Drug use: Not on file   Sexual activity: Not on file  Other Topics Concern   Not on file  Social History Narrative   Single.   Works at [employer].   From [place] .   Enjoys Licensed conveyancer.    Social Drivers of Corporate investment banker Strain: Not on file  Food Insecurity: Not on file  Transportation Needs: Not on file  Physical Activity: Not on file  Stress: Not on file  Social Connections: Not on file  Intimate Partner Violence: Not on file    History reviewed. No pertinent surgical history.  Family History  Problem Relation Age of Onset   Hypertension Mother    Endometriosis Mother    Hypertension Father    Heart attack Father    Diabetes  Father     Allergies  Allergen Reactions   Strawberry Extract     Other reaction(s): Dermatologic Reaction    Current Outpatient Medications on File Prior to Visit  Medication Sig Dispense Refill   tirzepatide (MOUNJARO) 5 MG/0.5ML Pen Inject 5 mg into the skin once a week.     No current facility-administered medications on file prior to visit.    BP 126/78   Pulse 79   Temp 98 F (36.7 C) (Temporal)   Ht 5\' 8"  (1.727 m)   Wt (!) 301 lb (136.5 kg)   LMP 04/16/2024 (Exact Date)   SpO2 97%   BMI 45.77 kg/m  Objective:   Physical Exam Cardiovascular:     Rate and Rhythm: Normal rate and regular rhythm.  Pulmonary:     Effort: Pulmonary effort is normal.     Breath sounds: Normal breath sounds.  Musculoskeletal:     Cervical back: Neck supple.  Skin:    General: Skin is warm and dry.  Neurological:     Mental Status: She is alert and oriented to person, place, and time.  Psychiatric:        Mood and Affect: Mood normal.  Assessment & Plan:  Menorrhagia with irregular cycle Assessment & Plan: Controlled.  Resume OCPs continuously. Refills provided.  Will update Pap smear in August/September when she has insurance.  Orders: -     Levonorgestrel -Ethinyl Estrad; Take 1 tablet by mouth daily. Do not break for menstrual cycle.  Dispense: 112 tablet; Refill: 0  Irregular periods/menstrual cycles -     Levonorgestrel -Ethinyl Estrad; Take 1 tablet by mouth daily. Do not break for menstrual cycle.  Dispense: 112 tablet; Refill: 0  Encounter for surveillance of contraceptive pills Assessment & Plan: Resume OCPs continuously. Will update Pap smear in August/September when she has insurance.  Refill provided.         Britain Anagnos K Dajanique Robley, NP

## 2024-07-10 ENCOUNTER — Other Ambulatory Visit: Payer: Self-pay | Admitting: Primary Care

## 2024-07-10 DIAGNOSIS — N926 Irregular menstruation, unspecified: Secondary | ICD-10-CM

## 2024-07-10 DIAGNOSIS — N921 Excessive and frequent menstruation with irregular cycle: Secondary | ICD-10-CM

## 2024-07-22 ENCOUNTER — Ambulatory Visit: Payer: Self-pay | Admitting: Primary Care

## 2024-08-30 ENCOUNTER — Ambulatory Visit: Payer: Self-pay | Admitting: Primary Care

## 2024-09-07 ENCOUNTER — Ambulatory Visit: Admitting: Primary Care

## 2024-09-07 ENCOUNTER — Encounter: Payer: Self-pay | Admitting: Primary Care

## 2024-09-07 ENCOUNTER — Other Ambulatory Visit (HOSPITAL_COMMUNITY)
Admission: RE | Admit: 2024-09-07 | Discharge: 2024-09-07 | Disposition: A | Source: Ambulatory Visit | Attending: Primary Care | Admitting: Primary Care

## 2024-09-07 VITALS — BP 124/78 | HR 95 | Temp 97.8°F | Ht 68.0 in | Wt 300.0 lb

## 2024-09-07 DIAGNOSIS — Z0000 Encounter for general adult medical examination without abnormal findings: Secondary | ICD-10-CM | POA: Diagnosis not present

## 2024-09-07 DIAGNOSIS — Z124 Encounter for screening for malignant neoplasm of cervix: Secondary | ICD-10-CM

## 2024-09-07 DIAGNOSIS — Z23 Encounter for immunization: Secondary | ICD-10-CM | POA: Diagnosis not present

## 2024-09-07 DIAGNOSIS — N921 Excessive and frequent menstruation with irregular cycle: Secondary | ICD-10-CM | POA: Diagnosis not present

## 2024-09-07 DIAGNOSIS — Z3041 Encounter for surveillance of contraceptive pills: Secondary | ICD-10-CM

## 2024-09-07 NOTE — Patient Instructions (Signed)
 It was a pleasure to see you today!

## 2024-09-07 NOTE — Assessment & Plan Note (Addendum)
 Immunizations UTD. Influenza vaccine provided today.  Pap smear due, completed today  Discussed the importance of a healthy diet and regular exercise in order for weight loss, and to reduce the risk of further co-morbidity.  Exam stable.   Follow up in 1 year for repeat physical.

## 2024-09-07 NOTE — Progress Notes (Signed)
 Subjective:    Patient ID: Yolanda Rojas, female    DOB: 05-Apr-1996, 28 y.o.   MRN: [id number]  Yolanda Rojas is a very pleasant 28 y.o. female who presents today for complete physical and follow up of chronic conditions.  Immunizations: -Tetanus: Completed in 2020 -Influenza: Influenza vaccine provided today.  -HPV: Completed series  Diet: Fair diet.  Exercise: No regular exercise.  Eye exam: Completes annually  Dental exam: Completes semi-annually    Pap Smear: Completed in 2020  BP Readings from Last 3 Encounters:  09/07/24 124/78  04/21/24 126/78  03/19/24 (!) 156/97      Review of Systems  Constitutional:  Negative for unexpected weight change.  HENT:  Negative for rhinorrhea.   Respiratory:  Negative for cough and shortness of breath.   Cardiovascular:  Negative for chest pain.  Gastrointestinal:  Negative for constipation and diarrhea.  Genitourinary:  Negative for difficulty urinating and menstrual problem.  Musculoskeletal:  Negative for arthralgias and myalgias.  Skin:  Negative for rash.  Allergic/Immunologic: Negative for environmental allergies.  Neurological:  Negative for dizziness and headaches.  Psychiatric/Behavioral:  The patient is not nervous/anxious.          Past Medical History:  Diagnosis Date   HSP (Henoch Schonlein purpura)     Social History   Socioeconomic History   Marital status: Single    Spouse name: Not on file   Number of children: Not on file   Years of education: Not on file   Highest education level: Not on file  Occupational History   Not on file  Tobacco Use   Smoking status: Never   Smokeless tobacco: Never  Substance and Sexual Activity   Alcohol use: Yes   Drug use: Not on file   Sexual activity: Not on file  Other Topics Concern   Not on file  Social History Narrative   Single.   Works at [employer].   From [place] .   Enjoys Licensed conveyancer.    Social Drivers of Manufacturing engineer Strain: Not on file  Food Insecurity: Not on file  Transportation Needs: Not on file  Physical Activity: Not on file  Stress: Not on file  Social Connections: Not on file  Intimate Partner Violence: Not on file    History reviewed. No pertinent surgical history.  Family History  Problem Relation Age of Onset   Hypertension Mother    Endometriosis Mother    Hypertension Father    Heart attack Father    Diabetes Father     Allergies  Allergen Reactions   Strawberry Extract     Other reaction(s): Dermatologic Reaction    Current Outpatient Medications on File Prior to Visit  Medication Sig Dispense Refill   ALTAVERA 0.15-30 MG-MCG tablet TAKE 1 TABLET BY MOUTH DAILY. DO NOT BREAK FOR MENSTRUAL CYCLE. 112 tablet 0   No current facility-administered medications on file prior to visit.    BP 124/78   Pulse 95   Temp 97.8 F (36.6 C) (Temporal)   Ht 5' 8 (1.727 m)   Wt 300 lb (136.1 kg)   LMP 08/31/2024   SpO2 99%   BMI 45.61 kg/m  Objective:   Physical Exam Exam conducted with a chaperone present.  HENT:     Right Ear: Tympanic membrane and ear canal normal.     Left Ear: Tympanic membrane and ear canal normal.  Eyes:     Pupils: Pupils are equal, round, and  reactive to light.  Cardiovascular:     Rate and Rhythm: Normal rate and regular rhythm.  Pulmonary:     Effort: Pulmonary effort is normal.     Breath sounds: Normal breath sounds.  Abdominal:     General: Bowel sounds are normal.     Palpations: Abdomen is soft.     Tenderness: There is no abdominal tenderness.  Genitourinary:    Labia:        Right: No rash, tenderness or lesion.        Left: No rash, tenderness or lesion.      Vagina: Normal.     Cervix: Cervical bleeding present.     Uterus: Normal.      Adnexa: Right adnexa normal and left adnexa normal.     Comments: Slight bleeding noted from cervical os. She is finishing menses.  Musculoskeletal:        General:  Normal range of motion.     Cervical back: Neck supple.  Skin:    General: Skin is warm and dry.  Neurological:     Mental Status: She is alert and oriented to person, place, and time.     Cranial Nerves: No cranial nerve deficit.     Deep Tendon Reflexes:     Reflex Scores:      Patellar reflexes are 2+ on the right side and 2+ on the left side. Psychiatric:        Mood and Affect: Mood normal.     Physical Exam        Assessment & Plan:  Preventative health care Assessment & Plan: Immunizations UTD. Influenza vaccine provided today.  Pap smear due, completed today.  Discussed the importance of a healthy diet and regular exercise in order for weight loss, and to reduce the risk of further co-morbidity.  Exam stable.   Follow up in 1 year for repeat physical.    Encounter for immunization -     Flu vaccine trivalent PF, 6mos and older(Flulaval,Afluria,Fluarix,Fluzone)  Menorrhagia with irregular cycle Assessment & Plan: Controlled.   Continue OCPs.  Will update pap smear today   Encounter for surveillance of contraceptive pills Assessment & Plan: Controlled.  Continue OCPs.  Pap smear today.     Assessment and Plan Assessment & Plan         Comer MARLA Gaskins, NP     History of Present Illness

## 2024-09-07 NOTE — Assessment & Plan Note (Signed)
 Controlled.   Continue OCPs.  Will update pap smear today

## 2024-09-07 NOTE — Addendum Note (Signed)
 Addended by: Shamari Lofquist K on: 09/07/2024 07:42 AM   Modules accepted: Orders

## 2024-09-07 NOTE — Assessment & Plan Note (Signed)
 Controlled.  Continue OCPs.  Pap smear today.

## 2024-09-08 ENCOUNTER — Ambulatory Visit: Payer: Self-pay | Admitting: Primary Care

## 2024-09-08 LAB — CYTOLOGY - PAP
Adequacy: ABSENT
Comment: NEGATIVE
Diagnosis: NEGATIVE
High risk HPV: NEGATIVE

## 2024-09-20 ENCOUNTER — Ambulatory Visit

## 2024-09-21 ENCOUNTER — Emergency Department
Admission: EM | Admit: 2024-09-21 | Discharge: 2024-09-21 | Disposition: A | Discharge status: Home / Self Care | Attending: Emergency Medicine | Admitting: Emergency Medicine

## 2024-09-21 ENCOUNTER — Encounter: Payer: Self-pay | Admitting: Emergency Medicine

## 2024-09-21 ENCOUNTER — Other Ambulatory Visit: Payer: Self-pay

## 2024-09-21 DIAGNOSIS — R1012 Left upper quadrant pain: Secondary | ICD-10-CM | POA: Insufficient documentation

## 2024-09-21 LAB — COMPREHENSIVE METABOLIC PANEL WITH GFR
ALT: 16 U/L (ref 0–44)
AST: 19 U/L (ref 15–41)
Albumin: 4 g/dL (ref 3.5–5.0)
Alkaline Phosphatase: 59 U/L (ref 38–126)
Anion gap: 11 (ref 5–15)
BUN: 14 mg/dL (ref 6–20)
CO2: 23 mmol/L (ref 22–32)
Calcium: 9.5 mg/dL (ref 8.9–10.3)
Chloride: 104 mmol/L (ref 98–111)
Creatinine, Ser: 0.75 mg/dL (ref 0.44–1.00)
GFR, Estimated: >60 mL/min (ref >60)
Glucose, Bld: 120 mg/dL — ABNORMAL HIGH (ref 70–99)
Potassium: 4 mmol/L (ref 3.5–5.1)
Sodium: 138 mmol/L (ref 135–145)
Total Bilirubin: 0.4 mg/dL (ref 0.0–1.2)
Total Protein: 8.3 g/dL — ABNORMAL HIGH (ref 6.5–8.1)

## 2024-09-21 LAB — CBC
HCT: 44.5 % (ref 36.0–46.0)
Hemoglobin: 14.9 g/dL (ref 12.0–15.0)
MCH: 30.7 pg (ref 26.0–34.0)
MCHC: 33.5 g/dL (ref 30.0–36.0)
MCV: 91.6 fL (ref 80.0–100.0)
Platelets: 313 K/uL (ref 150–400)
RBC: 4.86 MIL/uL (ref 3.87–5.11)
RDW: 12.6 % (ref 11.5–15.5)
WBC: 7.6 K/uL (ref 4.0–10.5)
nRBC: 0 % (ref 0.0–0.2)

## 2024-09-21 LAB — CHLAMYDIA/NGC RT PCR (ARMC ONLY)
Chlamydia Tr: NOT DETECTED
N gonorrhoeae: NOT DETECTED

## 2024-09-21 LAB — URINALYSIS, ROUTINE W REFLEX MICROSCOPIC
Bilirubin Urine: NEGATIVE
Glucose, UA: NEGATIVE mg/dL
Hgb urine dipstick: NEGATIVE
Ketones, ur: NEGATIVE mg/dL
Nitrite: NEGATIVE
Protein, ur: NEGATIVE mg/dL
Specific Gravity, Urine: 1.019 (ref 1.005–1.030)
pH: 6 (ref 5.0–8.0)

## 2024-09-21 LAB — WET PREP, GENITAL
Clue Cells Wet Prep HPF POC: NONE SEEN
Sperm: NONE SEEN
Trich, Wet Prep: NONE SEEN
WBC, Wet Prep HPF POC: <10 (ref <10)
Yeast Wet Prep HPF POC: NONE SEEN

## 2024-09-21 LAB — LIPASE, BLOOD: Lipase: 30 U/L (ref 11–51)

## 2024-09-21 LAB — POC URINE PREG, ED: Preg Test, Ur: NEGATIVE

## 2024-09-21 NOTE — ED Provider Notes (Signed)
 [facility] Provider Note    Event Date/Time   First MD Initiated Contact with Patient 09/21/24 1648     (approximate)   History   Abdominal Pain   HPI  Yolanda Rojas is a 28 y.o. female with PMH of HSP who presents for evaluation of abdominal pain. Patient states it has been intermittent for a few months. Pain is mostly on the left side. She thought it was related to gastritis or GERD and has been treated for this in the past so she restarted her PPI which has helped some. She denies N/V/D/C, fevers, urinary symptoms, abnormal vaginal discharge.      Physical Exam   Triage Vital Signs: ED Triage Vitals  Encounter Vitals Group     BP 09/21/24 1617 (!) 147/91     Girls Systolic BP Percentile --      Girls Diastolic BP Percentile --      Boys Systolic BP Percentile --      Boys Diastolic BP Percentile --      Pulse Rate 09/21/24 1617 98     Resp 09/21/24 1617 18     Temp 09/21/24 1617 98.1 F (36.7 C)     Temp Source 09/21/24 1617 Oral     SpO2 09/21/24 1617 99 %     Weight 09/21/24 1617 300 lb (136.1 kg)     Height 09/21/24 1617 5' 9 (1.753 m)     Head Circumference --      Peak Flow --      Pain Score 09/21/24 1622 5     Pain Loc --      Pain Education --      Exclude from Growth Chart --     Most recent vital signs: Vitals:   09/21/24 1617  BP: (!) 147/91  Pulse: 98  Resp: 18  Temp: 98.1 F (36.7 C)  SpO2: 99%   General: Awake, no distress. CV:  Good peripheral perfusion. RRR. Resp:  Normal effort. CTAB. Abd:  No distention. No CVA tenderness, mild TTP in LLQ.  Other:     ED Results / Procedures / Treatments   Labs (all labs ordered are listed, but only abnormal results are displayed) Labs Reviewed  COMPREHENSIVE METABOLIC PANEL WITH GFR - Abnormal; Notable for the following components:      Result Value   Glucose, Bld 120 (*)    Total Protein 8.3 (*)    All other components within normal limits  URINALYSIS, ROUTINE  W REFLEX MICROSCOPIC - Abnormal; Notable for the following components:   Color, Urine YELLOW (*)    APPearance HAZY (*)    Leukocytes,Ua TRACE (*)    Bacteria, UA RARE (*)    All other components within normal limits  CHLAMYDIA/NGC RT PCR ([facility] ONLY)            WET PREP, GENITAL  LIPASE, BLOOD  CBC  POC URINE PREG, ED   PROCEDURES:  Critical Care performed: No  Procedures   MEDICATIONS ORDERED IN ED: Medications - No data to display   IMPRESSION / MDM / ASSESSMENT AND PLAN / ED COURSE  I reviewed the triage vital signs and the nursing notes.                             28 year old female presents for evaluation of abdominal pain. VSS aside from elevated BP, patient NAD on exam.   Differential diagnosis includes,  but is not limited to, biliary disease, constipation, diverticulitis, pancreatitis, GERD, gastritis, nephrolithiasis, muscle strain, ovarian cyst.   Patient's presentation is most consistent with acute complicated illness / injury requiring diagnostic workup.  CBC wnl. CMP unremarkable, lipase is normal. Pregnancy test is negative. UA shows trace leukocytes and rare bacteria with some squamous cells so I suspect this is contaminant.   Patient has not had any abnormal vaginal discharge but does wish to be tested for STDs. Since she is asymptomatic and has not had any known exposures I am comfortable discharging her before receiving results. Patient made aware she will need to follow her results on my chart and that she can seek treatment at the health department, UC, PCP or ED if positive.   Discussed getting CT imaging or pelvic ultrasound to further evaluate her pain. Given reassuring labs, minimal pain on palpation and some improvement after taking the PPI, I have low suspicion for acute life threatening abdominal pathology. Patient initially wanted to have the imaging and then changed her mind. We reviewed the limitations of the work up by not getting imaging and  discussed strict return precautions with any worsening symptoms. I offered GI follow up which she was amenable to. Patient was given reassurance. She voiced understanding, all questions were answered and she was stable at discharge.     FINAL CLINICAL IMPRESSION(S) / ED DIAGNOSES   Final diagnoses:  Left upper quadrant abdominal pain     Rx / DC Orders   ED Discharge Orders     None        Note:  This document was prepared using Dragon voice recognition software and may include unintentional dictation errors.   Cleaster Tinnie LABOR, PA-C 09/21/24 1755    Arlander Charleston, MD 09/21/24 [phone]

## 2024-09-21 NOTE — ED Triage Notes (Signed)
 Pt to ER with c/o lower abdominal pain that radiates into sides.  States has been intermittent for months but started more frequently in the last several days.  Denies n/v/d or constipation.

## 2024-09-21 NOTE — Discharge Instructions (Signed)
 Your blood work was reassuring today. Your urinalysis did not show signs of infection today. At the time of discharge the results for your STD testing were still pending. Please follow up with these on MyChart. If you test positive for anything, you can seek treatment from your primary care, urgent care, the health department or the ED.   We elected to not get imaging today. This can be ordered by your primary care provider, the GI specialist or your OBGYN. You can also return to the ED any point for re-evaluation at which point imaging may be indicated.  I believe your pain is due to increased acid in your stomach. Please continue to take the over the counter acid medication we spoke about.  Please return to the ED with any worsening symptoms.

## 2024-11-13 ENCOUNTER — Other Ambulatory Visit: Payer: Self-pay | Admitting: Primary Care

## 2024-11-13 DIAGNOSIS — N926 Irregular menstruation, unspecified: Secondary | ICD-10-CM

## 2024-11-13 DIAGNOSIS — N921 Excessive and frequent menstruation with irregular cycle: Secondary | ICD-10-CM

## 2025-09-08 ENCOUNTER — Encounter: Admitting: Primary Care
